# Patient Record
Sex: Male | Born: 1975 | Race: White | Hispanic: No | Marital: Married
Health system: Southern US, Community
[De-identification: ages and names within clinical notes are randomized; demographics above are authoritative.]

---

## 1999-02-23 ENCOUNTER — Encounter: Payer: Self-pay | Admitting: Emergency Medicine

## 1999-02-23 ENCOUNTER — Emergency Department (HOSPITAL_COMMUNITY): Admission: EM | Admit: 1999-02-23 | Discharge: 1999-02-23 | Payer: Self-pay | Admitting: Emergency Medicine

## 2003-11-26 ENCOUNTER — Emergency Department (HOSPITAL_COMMUNITY): Admission: EM | Admit: 2003-11-26 | Discharge: 2003-11-26 | Payer: Self-pay | Admitting: Emergency Medicine

## 2005-10-14 ENCOUNTER — Ambulatory Visit (HOSPITAL_COMMUNITY): Admission: RE | Admit: 2005-10-14 | Discharge: 2005-10-14 | Payer: Self-pay | Admitting: Internal Medicine

## 2007-01-26 ENCOUNTER — Emergency Department (HOSPITAL_COMMUNITY): Admission: EM | Admit: 2007-01-26 | Discharge: 2007-01-26 | Payer: Self-pay | Admitting: Emergency Medicine

## 2010-09-14 ENCOUNTER — Ambulatory Visit (HOSPITAL_COMMUNITY): Admission: RE | Admit: 2010-09-14 | Discharge: 2010-09-14 | Payer: Self-pay | Admitting: Internal Medicine

## 2012-02-24 IMAGING — CR DG THORACIC SPINE 2V
3 series · 3 of 3 positions shown · non-contrast
Comparison: 10/14/2005

CLINICAL DATA: Mid to low back pain, chronic.

THORACIC SPINE - 2 VIEW

[view not recorded (1 of 3)]
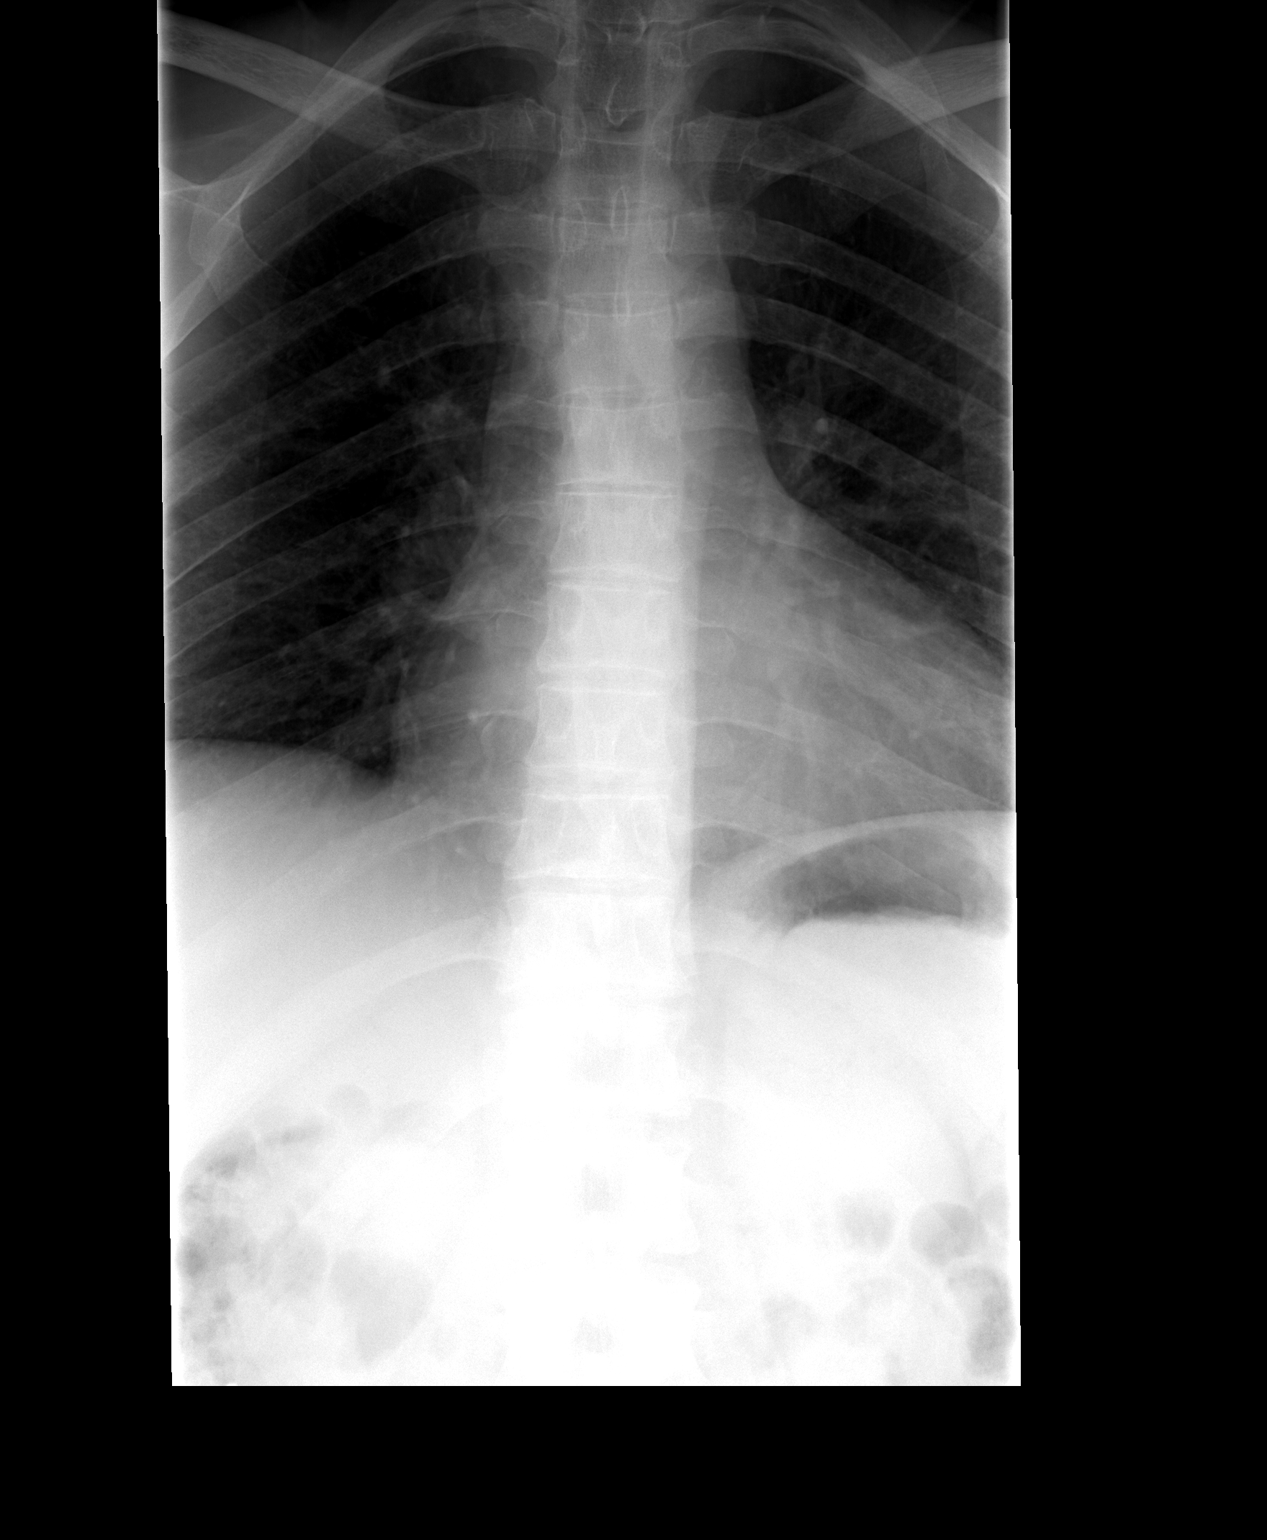

[view not recorded (2 of 3)]
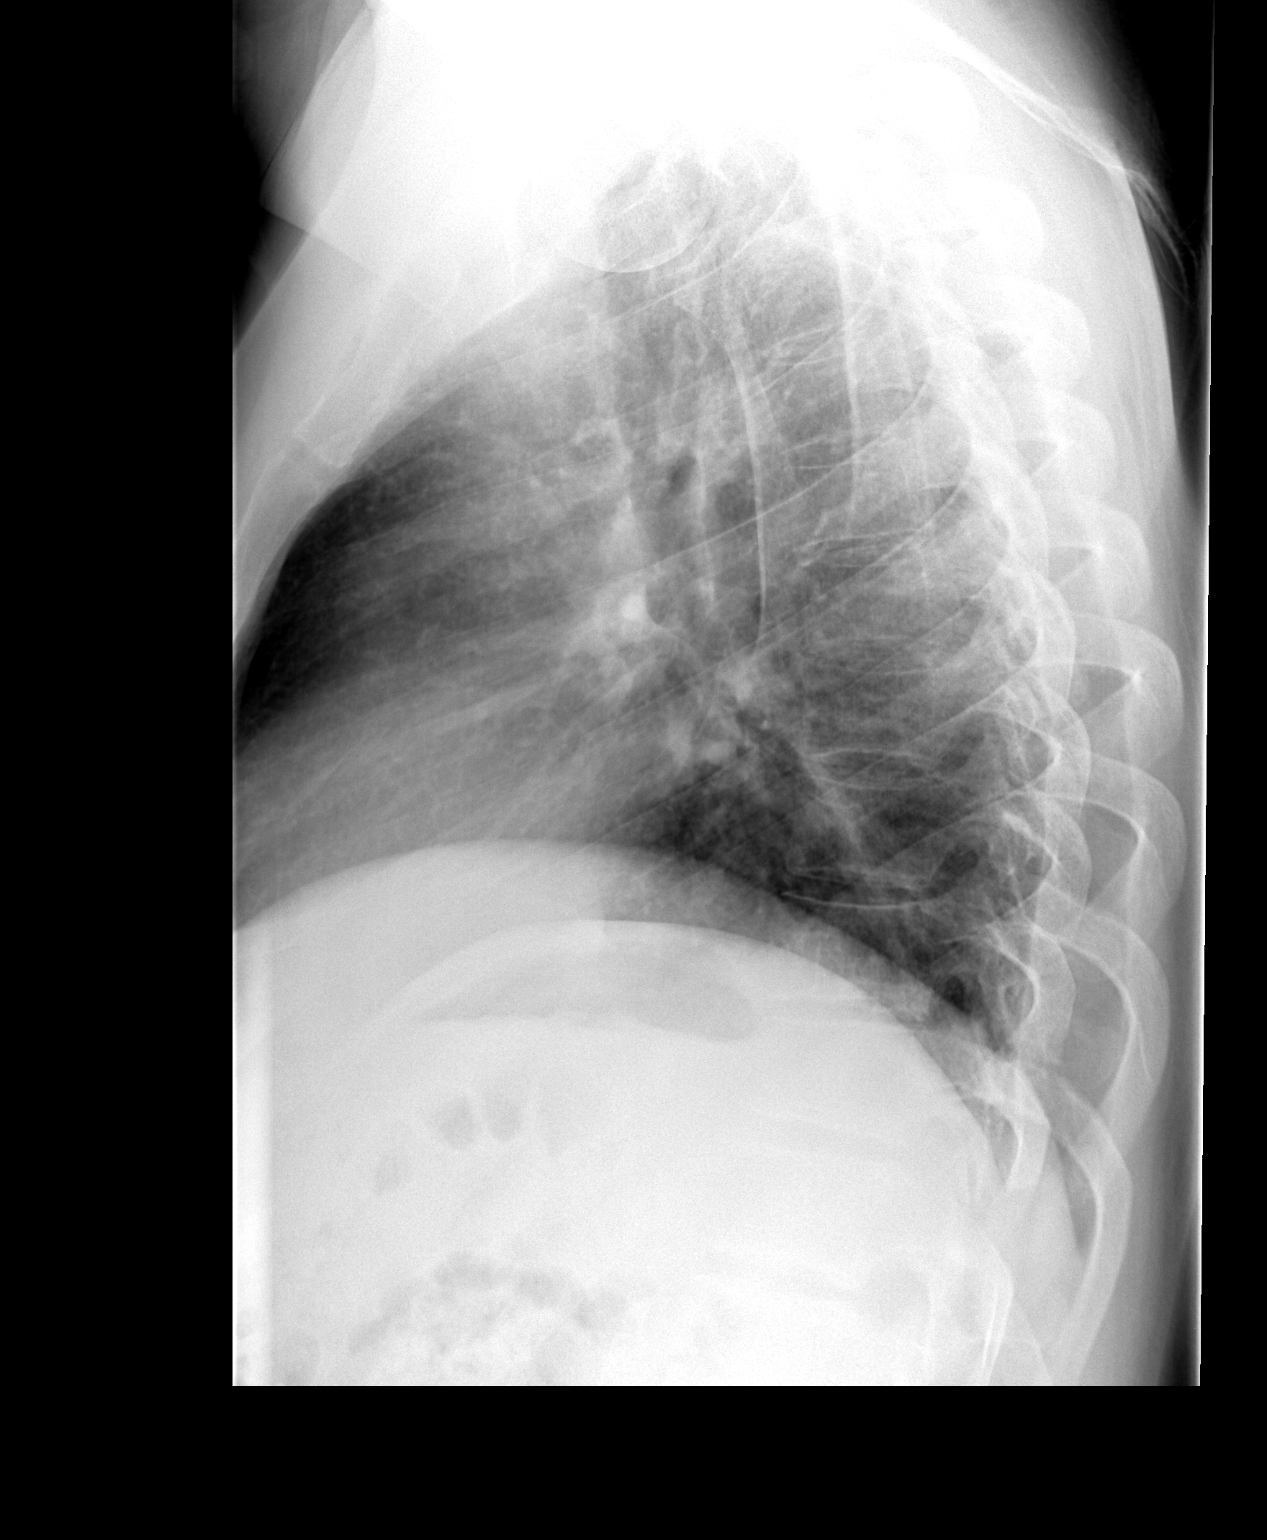

[view not recorded (3 of 3)]
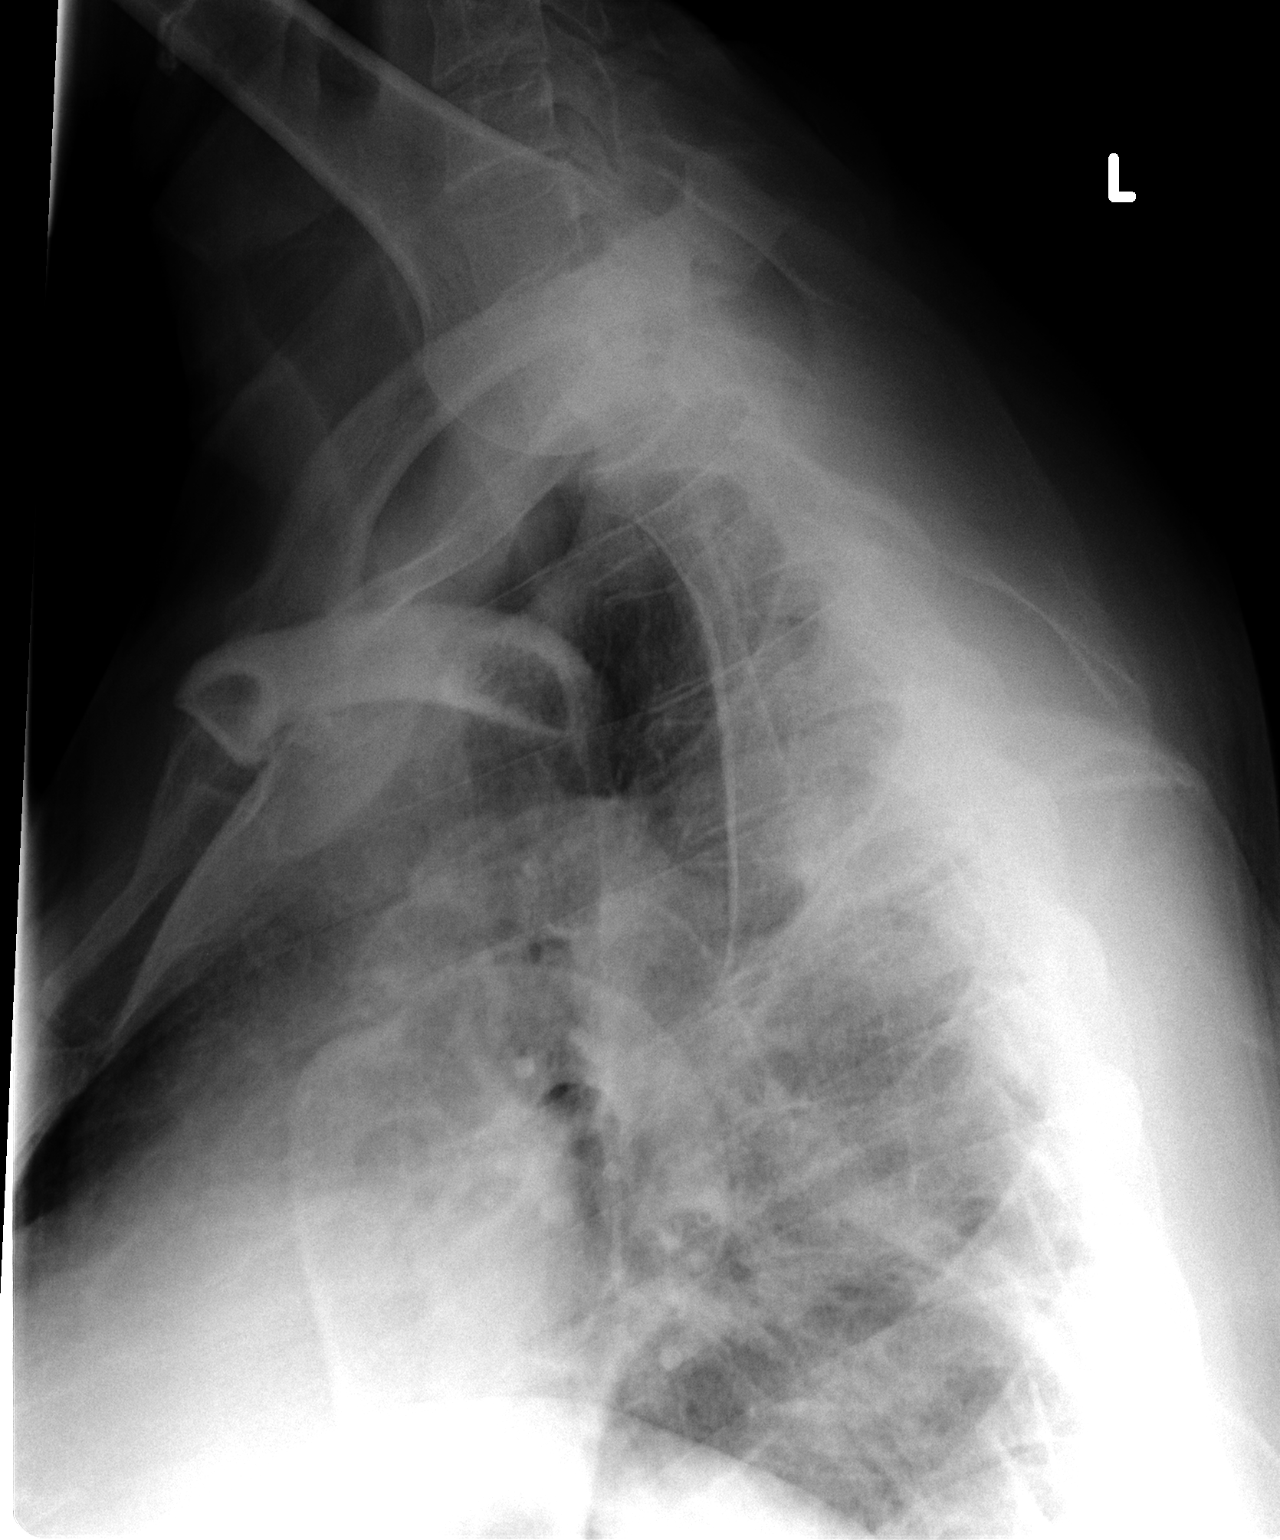

[3 of 3 positions shown; findings below may reference images not displayed]

FINDINGS: The T1 vertebra is excluded on the frontal projection.

No compression fracture is identified.  No thoracic subluxation
noted.  No overt spondylosis identified.
IMPRESSION: 1.  No significant abnormality identified.

## 2019-06-29 ENCOUNTER — Other Ambulatory Visit: Payer: Self-pay

## 2019-06-29 DIAGNOSIS — Z20822 Contact with and (suspected) exposure to covid-19: Secondary | ICD-10-CM

## 2019-07-03 LAB — NOVEL CORONAVIRUS, NAA: SARS-CoV-2, NAA: NOT DETECTED

## 2019-10-20 ENCOUNTER — Other Ambulatory Visit: Payer: Self-pay

## 2019-10-20 DIAGNOSIS — Z20822 Contact with and (suspected) exposure to covid-19: Secondary | ICD-10-CM

## 2019-10-21 ENCOUNTER — Telehealth: Payer: Self-pay

## 2019-10-21 LAB — NOVEL CORONAVIRUS, NAA: SARS-CoV-2, NAA: NOT DETECTED

## 2019-10-21 NOTE — Telephone Encounter (Signed)
Negative COVID results given. Patient results "NOT Detected." Caller expressed understanding. ° °
